# Patient Record
Sex: Male | Born: 2000 | Race: Black or African American | Hispanic: No | Marital: Single | Smoking: Never smoker
Health system: Southern US, Community
[De-identification: ages and names within clinical notes are randomized; demographics above are authoritative.]

## PROBLEM LIST (undated history)

## (undated) DIAGNOSIS — Z21 Asymptomatic human immunodeficiency virus [HIV] infection status: Secondary | ICD-10-CM

## (undated) HISTORY — PX: OTHER SURGICAL HISTORY: SHX169

---
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
## 2019-05-27 ENCOUNTER — Emergency Department (HOSPITAL_BASED_OUTPATIENT_CLINIC_OR_DEPARTMENT_OTHER)
Admission: EM | Admit: 2019-05-27 | Discharge: 2019-05-27 | Disposition: A | Discharge status: Home / Self Care | Payer: Self-pay | Attending: Emergency Medicine | Admitting: Emergency Medicine

## 2019-05-27 ENCOUNTER — Emergency Department (HOSPITAL_BASED_OUTPATIENT_CLINIC_OR_DEPARTMENT_OTHER): Payer: Self-pay

## 2019-05-27 ENCOUNTER — Other Ambulatory Visit: Payer: Self-pay

## 2019-05-27 ENCOUNTER — Encounter (HOSPITAL_BASED_OUTPATIENT_CLINIC_OR_DEPARTMENT_OTHER): Payer: Self-pay

## 2019-05-27 DIAGNOSIS — Y998 Other external cause status: Secondary | ICD-10-CM | POA: Insufficient documentation

## 2019-05-27 DIAGNOSIS — S63502A Unspecified sprain of left wrist, initial encounter: Secondary | ICD-10-CM

## 2019-05-27 DIAGNOSIS — Y92410 Unspecified street and highway as the place of occurrence of the external cause: Secondary | ICD-10-CM | POA: Insufficient documentation

## 2019-05-27 DIAGNOSIS — Y93I9 Activity, other involving external motion: Secondary | ICD-10-CM | POA: Insufficient documentation

## 2019-05-27 MED ORDER — ACETAMINOPHEN 325 MG PO TABS
650.0000 mg | ORAL_TABLET | Freq: Once | ORAL | Status: AC
  Administered 2019-05-27: 13:00:00 650 mg via ORAL
  Filled 2019-05-27: qty 2
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
## 2019-05-27 NOTE — ED Notes (Signed)
Pt denies back or neck pain at this time. Denies loc.
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
## 2020-05-11 ENCOUNTER — Encounter (HOSPITAL_BASED_OUTPATIENT_CLINIC_OR_DEPARTMENT_OTHER): Payer: Self-pay

## 2020-05-11 ENCOUNTER — Other Ambulatory Visit: Payer: Self-pay

## 2020-05-11 ENCOUNTER — Emergency Department (HOSPITAL_BASED_OUTPATIENT_CLINIC_OR_DEPARTMENT_OTHER)
Admission: EM | Admit: 2020-05-11 | Discharge: 2020-05-11 | Disposition: A | Discharge status: Home / Self Care | Payer: Self-pay | Attending: Emergency Medicine | Admitting: Emergency Medicine

## 2020-05-11 DIAGNOSIS — R42 Dizziness and giddiness: Secondary | ICD-10-CM | POA: Insufficient documentation

## 2020-05-11 DIAGNOSIS — R001 Bradycardia, unspecified: Secondary | ICD-10-CM | POA: Insufficient documentation

## 2020-05-11 LAB — BASIC METABOLIC PANEL
Anion gap: 9 (ref 5–15)
BUN: 14 mg/dL (ref 6–20)
CO2: 23 mmol/L (ref 22–32)
Calcium: 8.9 mg/dL (ref 8.9–10.3)
Chloride: 106 mmol/L (ref 98–111)
Creatinine, Ser: 1.02 mg/dL (ref 0.61–1.24)
GFR calc Af Amer: >60 mL/min (ref >60)
GFR calc non Af Amer: >60 mL/min (ref >60)
Glucose, Bld: 75 mg/dL (ref 70–99)
Potassium: 4.5 mmol/L (ref 3.5–5.1)
Sodium: 138 mmol/L (ref 135–145)

## 2020-05-11 LAB — CBC
HCT: 45.6 % (ref 39.0–52.0)
Hemoglobin: 15 g/dL (ref 13.0–17.0)
MCH: 31.1 pg (ref 26.0–34.0)
MCHC: 32.9 g/dL (ref 30.0–36.0)
MCV: 94.6 fL (ref 80.0–100.0)
Platelets: 280 10*3/uL (ref 150–400)
RBC: 4.82 MIL/uL (ref 4.22–5.81)
RDW: 12.5 % (ref 11.5–15.5)
WBC: 6.5 10*3/uL (ref 4.0–10.5)
nRBC: 0 % (ref 0.0–0.2)

## 2020-05-11 LAB — CBG MONITORING, ED: Glucose-Capillary: 89 mg/dL (ref 70–99)

## 2020-05-11 MED ORDER — SODIUM CHLORIDE 0.9% FLUSH
3.0000 mL | Freq: Once | INTRAVENOUS | Status: DC
  Filled 2020-05-11: qty 3

## 2020-05-11 NOTE — ED Triage Notes (Signed)
Pt states he woke up dizzy ~9am today-denies fever/flu sx-denies pain-sates he has hx of dizziness when he gets hot-NAD-steady gait

## 2020-05-11 NOTE — ED Provider Notes (Signed)
[facility] EMERGENCY DEPARTMENT Provider Note   CSN: [id number] Arrival date & time: 05/11/20  1434     History Chief Complaint  Patient presents with  . Dizziness    Jesus Cherry is a 19 y.o. male.  HPI 19 year old male with complaints of dizziness.  Patient states he woke up this morning with feeling dizzy, states he has a history of this when he spends a lot of time outside.  He says that like yesterday he spent some time outside with friends.  States that he does not drink any water.  He denies any vision changes, headaches, nausea, vomiting, chest pain or shortness of breath.  He states that he felt a little better after he woke up, however when he was in the ER, he had an episode where he felt he was going to pass out.  He had reported to nursing staff in the ER that he has a history of passing out when he sees blood.  He started to feel dizzy as blood was being drawn.  He denies any dizziness or any other symptoms at this current moment.  He has not passed out or hit his head.    History reviewed. No pertinent past medical history.  There are no problems to display for this patient.   History reviewed. No pertinent surgical history.     No family history on file.  Social History   Tobacco Use  . Smoking status: Never Smoker  . Smokeless tobacco: Never Used  Vaping Use  . Vaping Use: Never used  Substance Use Topics  . Alcohol use: Never  . Drug use: No    Home Medications Prior to Admission medications   Medication Sig Start Date End Date Taking? Authorizing Provider  cetirizine (ZYRTEC) 5 MG chewable tablet Chew 5 mg by mouth daily.    [provider]  clotrimazole (LOTRIMIN) 1 % cream Apply to affected area 2 times daily 09/05/14   Pollina, Canary Brim, MD  griseofulvin (GRIFULVIN V) 500 MG tablet Take 1 tablet (500 mg total) by mouth 2 (two) times daily. 09/05/14   Gilda Crease, MD  montelukast (SINGULAIR) 10 MG tablet Take 10  mg by mouth at bedtime.    [provider]  phentermine 37.5 MG capsule Take 37.5 mg by mouth every morning.    [provider]    Allergies    Patient has no known allergies.  Review of Systems   Review of Systems  Constitutional: Negative for chills and fever.  Eyes: Negative for photophobia and visual disturbance.  Respiratory: Negative for cough and shortness of breath.   Cardiovascular: Negative for chest pain.  Gastrointestinal: Negative for abdominal pain and nausea.  Neurological: Positive for dizziness. Negative for syncope, weakness and headaches.    Physical Exam Updated Vital Signs BP (!) 100/50 (BP Location: Right Arm)   Pulse (!) 59   Temp 98.1 F (36.7 C) (Oral)   Resp 18   Ht 5\' 7"  (1.702 m)   Wt 82.1 kg   SpO2 100%   BMI 28.35 kg/m   Physical Exam Vitals and nursing note reviewed.  Constitutional:      Appearance: Normal appearance. He is well-developed.  HENT:     Head: Normocephalic and atraumatic.     Comments: Horizontal nystagmus noted    Mouth/Throat:     Mouth: Mucous membranes are moist.     Pharynx: Oropharynx is clear.  Eyes:     Conjunctiva/sclera: Conjunctivae normal.  Cardiovascular:  Rate and Rhythm: Normal rate and regular rhythm.     Heart sounds: No murmur heard.   Pulmonary:     Effort: Pulmonary effort is normal. No respiratory distress.     Breath sounds: Normal breath sounds.  Abdominal:     General: Abdomen is flat.     Palpations: Abdomen is soft.     Tenderness: There is no abdominal tenderness.  Musculoskeletal:        General: No tenderness or signs of injury.     Cervical back: Normal range of motion and neck supple.  Skin:    General: Skin is warm and dry.     Capillary Refill: Capillary refill takes less than 2 seconds.     Findings: No erythema or rash.  Neurological:     General: No focal deficit present.     Mental Status: He is alert and oriented to person, place, and time.      Sensory: No sensory deficit.     Motor: No weakness.     ED Results / Procedures / Treatments   Labs (all labs ordered are listed, but only abnormal results are displayed) Labs Reviewed  BASIC METABOLIC PANEL  CBC  URINALYSIS, ROUTINE W REFLEX MICROSCOPIC  CBG MONITORING, ED    EKG EKG Interpretation  Date/Time:  Wednesday May 11 2020 15:09:12 EDT Ventricular Rate:  48 PR Interval:  144 QRS Duration: 92 QT Interval:  402 QTC Calculation: 359 R Axis:   93 Text Interpretation: Sinus bradycardia Possible Left atrial enlargement Rightward axis Borderline ECG No prior ecg for comparison Confirmed by Geoffery Lyons ([id number]) on 05/11/2020 4:13:10 PM   Radiology No results found.  Procedures Procedures (including critical care time)  Medications Ordered in ED Medications  sodium chloride flush (NS) 0.9 % injection 3 mL (3 mLs Intravenous Not Given 05/11/20 1612)    ED Course  I have reviewed the triage vital signs and the nursing notes.  Pertinent labs & imaging results that were available during my care of the patient were reviewed by me and considered in my medical decision making (see chart for details).    MDM Rules/Calculators/A&P                         Patient here complains of several episodes of dizziness today.  No complaints at this time.  Some horizontal nystagmus noted, but otherwise neuro exam normal.  I personally reviewed his lab work, which showed no abnormalities on CBC or BMP.  CBG of 89.  EKG sinus bradycardia, but no arrhythmias.  Soft blood pressures, patient given some oral fluids here in the ER and reports feeling better.  Orthostatic blood pressures performed which were normal.  Do not think he needs any additional imaging of his head or any further work-up at this time.  Patient is able to ambulate in the ED without any episodes of dizziness.  He is overall feeling well and is ready to go home.  I think this is reasonable.  I encouraged him to follow-up  with his primary care doctor.  Return precautions discussed.  All the patient's questions have been answered to his satisfaction, he voices understanding and is agreeable to this plan.  At this stage in the ED course, the patient is medically screened and stable for discharge.  Final Clinical Impression(s) / ED Diagnoses Final diagnoses:  Dizziness    Rx / DC Orders ED Discharge Orders    None  Mare Ferrari, PA-C 05/11/20 1754    Geoffery Lyons, MD 05/11/20 [phone]

## 2020-05-11 NOTE — Discharge Instructions (Signed)
Please make sure to follow-up with your primary care doctor about your dizziness.  Please make sure to drink your body weight in ounces of water.  Return to the ER if your symptoms worsen.

## 2020-05-11 NOTE — ED Notes (Addendum)
After CBG I stepped outside triage room to get EKG machine when entered pt states he feels like he is going to pass out-pt placed supine with head down/feet up-EKG performed-pt denies hx of near syncope/syncope with sight of blood/blood draw-pt states he felt better-labs drawn-pt up to w/c and to ED WR

## 2020-05-11 NOTE — ED Notes (Signed)
PT given water per charge RN, Plans to orthostatic vital sign and ambulate per RN

## 2020-07-23 ENCOUNTER — Emergency Department (HOSPITAL_BASED_OUTPATIENT_CLINIC_OR_DEPARTMENT_OTHER)
Admission: EM | Admit: 2020-07-23 | Discharge: 2020-07-23 | Disposition: A | Discharge status: Home / Self Care | Payer: Self-pay | Attending: Emergency Medicine | Admitting: Emergency Medicine

## 2020-07-23 ENCOUNTER — Encounter (HOSPITAL_BASED_OUTPATIENT_CLINIC_OR_DEPARTMENT_OTHER): Payer: Self-pay | Admitting: Emergency Medicine

## 2020-07-23 ENCOUNTER — Other Ambulatory Visit: Payer: Self-pay

## 2020-07-23 DIAGNOSIS — Z20822 Contact with and (suspected) exposure to covid-19: Secondary | ICD-10-CM | POA: Insufficient documentation

## 2020-07-23 DIAGNOSIS — J020 Streptococcal pharyngitis: Secondary | ICD-10-CM | POA: Insufficient documentation

## 2020-07-23 DIAGNOSIS — E860 Dehydration: Secondary | ICD-10-CM | POA: Insufficient documentation

## 2020-07-23 LAB — COMPREHENSIVE METABOLIC PANEL
ALT: 18 U/L (ref 0–44)
AST: 30 U/L (ref 15–41)
Albumin: 3.6 g/dL (ref 3.5–5.0)
Alkaline Phosphatase: 52 U/L (ref 38–126)
Anion gap: 10 (ref 5–15)
BUN: 9 mg/dL (ref 6–20)
CO2: 25 mmol/L (ref 22–32)
Calcium: 8.2 mg/dL — ABNORMAL LOW (ref 8.9–10.3)
Chloride: 98 mmol/L (ref 98–111)
Creatinine, Ser: 1.24 mg/dL (ref 0.61–1.24)
GFR calc Af Amer: >60 mL/min (ref >60)
GFR calc non Af Amer: >60 mL/min (ref >60)
Glucose, Bld: 82 mg/dL (ref 70–99)
Potassium: 4 mmol/L (ref 3.5–5.1)
Sodium: 133 mmol/L — ABNORMAL LOW (ref 135–145)
Total Bilirubin: <0.1 mg/dL — ABNORMAL LOW (ref 0.3–1.2)
Total Protein: 7.5 g/dL (ref 6.5–8.1)

## 2020-07-23 LAB — CBC WITH DIFFERENTIAL/PLATELET
Abs Immature Granulocytes: 0.01 10*3/uL (ref 0.00–0.07)
Basophils Absolute: 0 10*3/uL (ref 0.0–0.1)
Basophils Relative: 0 %
Eosinophils Absolute: 0 10*3/uL (ref 0.0–0.5)
Eosinophils Relative: 0 %
HCT: 45.3 % (ref 39.0–52.0)
Hemoglobin: 15.1 g/dL (ref 13.0–17.0)
Immature Granulocytes: 0 %
Lymphocytes Relative: 41 %
Lymphs Abs: 0.9 10*3/uL (ref 0.7–4.0)
MCH: 30.6 pg (ref 26.0–34.0)
MCHC: 33.3 g/dL (ref 30.0–36.0)
MCV: 91.7 fL (ref 80.0–100.0)
Monocytes Absolute: 0.2 10*3/uL (ref 0.1–1.0)
Monocytes Relative: 9 %
Neutro Abs: 1.1 10*3/uL — ABNORMAL LOW (ref 1.7–7.7)
Neutrophils Relative %: 50 %
Platelets: 165 10*3/uL (ref 150–400)
RBC: 4.94 MIL/uL (ref 4.22–5.81)
RDW: 12.6 % (ref 11.5–15.5)
WBC: 2.3 10*3/uL — ABNORMAL LOW (ref 4.0–10.5)
nRBC: 0 % (ref 0.0–0.2)

## 2020-07-23 LAB — MONONUCLEOSIS SCREEN: Mono Screen: NEGATIVE

## 2020-07-23 LAB — RESPIRATORY PANEL BY RT PCR (FLU A&B, COVID)
Influenza A by PCR: NEGATIVE
Influenza B by PCR: NEGATIVE
SARS Coronavirus 2 by RT PCR: NEGATIVE

## 2020-07-23 LAB — GROUP A STREP BY PCR: Group A Strep by PCR: DETECTED — AB

## 2020-07-23 MED ORDER — IBUPROFEN 600 MG PO TABS: 600.0000 mg | ORAL_TABLET | Freq: Four times a day (QID) | ORAL | 0 refills | Status: AC | PRN

## 2020-07-23 MED ORDER — PENICILLIN G BENZATHINE 1200000 UNIT/2ML IM SUSP
1.2000 10*6.[IU] | Freq: Once | INTRAMUSCULAR | Status: AC
  Administered 2020-07-23: 1.2 10*6.[IU] via INTRAMUSCULAR
  Filled 2020-07-23: qty 2

## 2020-07-23 MED ORDER — IBUPROFEN 800 MG PO TABS
800.0000 mg | ORAL_TABLET | Freq: Once | ORAL | Status: AC
  Administered 2020-07-23: 800 mg via ORAL
  Filled 2020-07-23: qty 1

## 2020-07-23 MED ORDER — ACETAMINOPHEN 500 MG PO TABS
1000.0000 mg | ORAL_TABLET | Freq: Once | ORAL | Status: AC
  Administered 2020-07-23: 1000 mg via ORAL
  Filled 2020-07-23: qty 2

## 2020-07-23 MED ORDER — SODIUM CHLORIDE 0.9 % IV BOLUS
1000.0000 mL | Freq: Once | INTRAVENOUS | Status: AC
  Administered 2020-07-23: 1000 mL via INTRAVENOUS

## 2020-07-23 NOTE — ED Provider Notes (Signed)
[facility] EMERGENCY DEPARTMENT Provider Note   CSN: [id number] Arrival date & time: 07/23/20  1123     History Chief Complaint  Patient presents with   Headache   Fever   Nausea    Jesus Cherry is a 19 y.o. male.  Pt presents to the ED today with fever, sore throat, headache.  Pt said he's been feeling ill for the past week.  He's not been vaccinated against Covid.  He has not taken anything for his fever.        History reviewed. No pertinent past medical history.  There are no problems to display for this patient.   History reviewed. No pertinent surgical history.     History reviewed. No pertinent family history.  Social History   Tobacco Use   Smoking status: Never Smoker   Smokeless tobacco: Never Used  Vaping Use   Vaping Use: Never used  Substance Use Topics   Alcohol use: Never   Drug use: No    Home Medications Prior to Admission medications   Medication Sig Start Date End Date Taking? Authorizing Provider  cetirizine (ZYRTEC) 5 MG chewable tablet Chew 5 mg by mouth daily.    [provider]  clotrimazole (LOTRIMIN) 1 % cream Apply to affected area 2 times daily 09/05/14   Pollina, Canary Brim, MD  griseofulvin (GRIFULVIN V) 500 MG tablet Take 1 tablet (500 mg total) by mouth 2 (two) times daily. 09/05/14   Gilda Crease, MD  ibuprofen (ADVIL) 600 MG tablet Take 1 tablet (600 mg total) by mouth every 6 (six) hours as needed. 07/23/20   Jacalyn Lefevre, MD  montelukast (SINGULAIR) 10 MG tablet Take 10 mg by mouth at bedtime.    [provider]  phentermine 37.5 MG capsule Take 37.5 mg by mouth every morning.    [provider]    Allergies    Patient has no known allergies.  Review of Systems   Review of Systems  Constitutional: Positive for fever.  HENT: Positive for sore throat.   Neurological: Positive for weakness and headaches.  All other systems reviewed and are  negative.   Physical Exam Updated Vital Signs BP (!) 101/51 (BP Location: Left Arm)    Pulse 82    Temp (!) 103.1 F (39.5 C) (Oral)    Resp 18    SpO2 100%   Physical Exam Vitals and nursing note reviewed.  Constitutional:      Appearance: He is well-developed.  HENT:     Head: Normocephalic and atraumatic.     Mouth/Throat:     Mouth: Mucous membranes are moist.     Pharynx: Pharyngeal swelling and posterior oropharyngeal erythema present.  Eyes:     Extraocular Movements: Extraocular movements intact.     Pupils: Pupils are equal, round, and reactive to light.  Cardiovascular:     Rate and Rhythm: Normal rate and regular rhythm.  Pulmonary:     Effort: Pulmonary effort is normal.     Breath sounds: Normal breath sounds.  Abdominal:     General: Bowel sounds are normal.     Palpations: Abdomen is soft.  Musculoskeletal:        General: Normal range of motion.     Cervical back: Normal range of motion and neck supple.  Skin:    General: Skin is warm.     Capillary Refill: Capillary refill takes less than 2 seconds.  Neurological:     Mental Status: He is alert  and oriented to person, place, and time.  Psychiatric:        Mood and Affect: Mood normal.        Speech: Speech normal.        Behavior: Behavior normal.     ED Results / Procedures / Treatments   Labs (all labs ordered are listed, but only abnormal results are displayed) Labs Reviewed  GROUP A STREP BY PCR - Abnormal; Notable for the following components:      Result Value   Group A Strep by PCR DETECTED (*)    All other components within normal limits  CBC WITH DIFFERENTIAL/PLATELET - Abnormal; Notable for the following components:   WBC 2.3 (*)    Neutro Abs 1.1 (*)    All other components within normal limits  COMPREHENSIVE METABOLIC PANEL - Abnormal; Notable for the following components:   Sodium 133 (*)    Calcium 8.2 (*)    Total Bilirubin <0.1 (*)    All other components within normal limits   RESPIRATORY PANEL BY RT PCR (FLU A&B, COVID)  MONONUCLEOSIS SCREEN    EKG None  Radiology No results found.  Procedures Procedures (including critical care time)  Medications Ordered in ED Medications  sodium chloride 0.9 % bolus 1,000 mL (0 mLs Intravenous Stopped 07/23/20 1402)  ibuprofen (ADVIL) tablet 800 mg (800 mg Oral Given 07/23/20 1240)  penicillin g benzathine (BICILLIN LA) 1200000 UNIT/2ML injection 1.2 Million Units (1.2 Million Units Intramuscular Given 07/23/20 1337)  acetaminophen (TYLENOL) tablet 1,000 mg (1,000 mg Oral Given 07/23/20 1337)    ED Course  I have reviewed the triage vital signs and the nursing notes.  Pertinent labs & imaging results that were available during my care of the patient were reviewed by me and considered in my medical decision making (see chart for details).    MDM Rules/Calculators/A&P                          Pt's fever treated with tylenol and ibuprofen.  Covid negative.  He is + for strep.  He opted for the bicillin IM injection.  He is feeling better after IVFs.  He is stable for d/c. Return if worse.  Jesus Cherry was evaluated in Emergency Department on 07/23/2020 for the symptoms described in the history of present illness. He was evaluated in the context of the global COVID-19 pandemic, which necessitated consideration that the patient might be at risk for infection with the SARS-CoV-2 virus that causes COVID-19. Institutional protocols and algorithms that pertain to the evaluation of patients at risk for COVID-19 are in a state of rapid change based on information released by regulatory bodies including the CDC and federal and state organizations. These policies and algorithms were followed during the patient's care in the ED.  Final Clinical Impression(s) / ED Diagnoses Final diagnoses:  Strep pharyngitis  Dehydration    Rx / DC Orders ED Discharge Orders         Ordered    ibuprofen (ADVIL) 600 MG tablet  Every 6 hours  PRN        07/23/20 1416           Jacalyn Lefevre, MD 07/23/20 1417

## 2020-07-23 NOTE — ED Triage Notes (Signed)
Pt here with severe headaches x 1 week with fever and nausea. Not vaccinated for COVID

## 2020-07-23 NOTE — Discharge Instructions (Addendum)
Alternate tylenol and ibuprofen for fever. °

## 2020-07-23 NOTE — ED Notes (Signed)
Pt ambulatory with steady gait to restroom 

## 2024-10-04 ENCOUNTER — Encounter (HOSPITAL_BASED_OUTPATIENT_CLINIC_OR_DEPARTMENT_OTHER): Payer: Self-pay | Admitting: Emergency Medicine

## 2024-10-04 ENCOUNTER — Emergency Department (HOSPITAL_BASED_OUTPATIENT_CLINIC_OR_DEPARTMENT_OTHER)
Admission: EM | Admit: 2024-10-04 | Discharge: 2024-10-04 | Disposition: A | Discharge status: Home / Self Care | Payer: Self-pay

## 2024-10-04 ENCOUNTER — Other Ambulatory Visit: Payer: Self-pay

## 2024-10-04 ENCOUNTER — Emergency Department (HOSPITAL_BASED_OUTPATIENT_CLINIC_OR_DEPARTMENT_OTHER): Payer: Self-pay

## 2024-10-04 DIAGNOSIS — R197 Diarrhea, unspecified: Secondary | ICD-10-CM | POA: Insufficient documentation

## 2024-10-04 DIAGNOSIS — Z21 Asymptomatic human immunodeficiency virus [HIV] infection status: Secondary | ICD-10-CM | POA: Insufficient documentation

## 2024-10-04 DIAGNOSIS — M791 Myalgia, unspecified site: Secondary | ICD-10-CM | POA: Insufficient documentation

## 2024-10-04 DIAGNOSIS — R519 Headache, unspecified: Secondary | ICD-10-CM | POA: Insufficient documentation

## 2024-10-04 DIAGNOSIS — R509 Fever, unspecified: Secondary | ICD-10-CM | POA: Insufficient documentation

## 2024-10-04 HISTORY — DX: Asymptomatic human immunodeficiency virus (hiv) infection status: Z21

## 2024-10-04 LAB — RESP PANEL BY RT-PCR (RSV, FLU A&B, COVID)  RVPGX2
Influenza A by PCR: NEGATIVE
Influenza B by PCR: NEGATIVE
Resp Syncytial Virus by PCR: NEGATIVE
SARS Coronavirus 2 by RT PCR: NEGATIVE

## 2024-10-04 LAB — CBC WITH DIFFERENTIAL/PLATELET
Abs Immature Granulocytes: 0.02 K/uL (ref 0.00–0.07)
Basophils Absolute: 0 K/uL (ref 0.0–0.1)
Basophils Relative: 0 %
Eosinophils Absolute: 0 K/uL (ref 0.0–0.5)
Eosinophils Relative: 0 %
HCT: 42.2 % (ref 39.0–52.0)
Hemoglobin: 14.4 g/dL (ref 13.0–17.0)
Immature Granulocytes: 0 %
Lymphocytes Relative: 12 %
Lymphs Abs: 0.8 K/uL (ref 0.7–4.0)
MCH: 31.6 pg (ref 26.0–34.0)
MCHC: 34.1 g/dL (ref 30.0–36.0)
MCV: 92.7 fL (ref 80.0–100.0)
Monocytes Absolute: 0.6 K/uL (ref 0.1–1.0)
Monocytes Relative: 9 %
Neutro Abs: 5.2 K/uL (ref 1.7–7.7)
Neutrophils Relative %: 79 %
Platelets: 192 K/uL (ref 150–400)
RBC: 4.55 MIL/uL (ref 4.22–5.81)
RDW: 12.7 % (ref 11.5–15.5)
WBC: 6.6 K/uL (ref 4.0–10.5)
nRBC: 0 % (ref 0.0–0.2)

## 2024-10-04 LAB — COMPREHENSIVE METABOLIC PANEL WITH GFR
ALT: 15 U/L (ref 0–44)
AST: 21 U/L (ref 15–41)
Albumin: 4.3 g/dL (ref 3.5–5.0)
Alkaline Phosphatase: 78 U/L (ref 38–126)
Anion gap: 12 (ref 5–15)
BUN: 6 mg/dL (ref 6–20)
CO2: 24 mmol/L (ref 22–32)
Calcium: 9.3 mg/dL (ref 8.9–10.3)
Chloride: 101 mmol/L (ref 98–111)
Creatinine, Ser: 1.04 mg/dL (ref 0.61–1.24)
GFR, Estimated: >60 mL/min (ref >60)
Glucose, Bld: 100 mg/dL — ABNORMAL HIGH (ref 70–99)
Potassium: 3.8 mmol/L (ref 3.5–5.1)
Sodium: 137 mmol/L (ref 135–145)
Total Bilirubin: 0.8 mg/dL (ref 0.0–1.2)
Total Protein: 7.2 g/dL (ref 6.5–8.1)

## 2024-10-04 MED ORDER — IOHEXOL 300 MG/ML  SOLN
100.0000 mL | Freq: Once | INTRAMUSCULAR | Status: AC | PRN
  Administered 2024-10-04: 100 mL via INTRAVENOUS

## 2024-10-04 MED ORDER — ONDANSETRON 4 MG PO TBDP: 4.0000 mg | ORAL_TABLET | Freq: Three times a day (TID) | ORAL | 0 refills | Status: AC | PRN

## 2024-10-04 MED ORDER — LACTATED RINGERS IV BOLUS
1000.0000 mL | Freq: Once | INTRAVENOUS | Status: AC
  Administered 2024-10-04: 1000 mL via INTRAVENOUS

## 2024-10-04 MED ORDER — KETOROLAC TROMETHAMINE 15 MG/ML IJ SOLN
15.0000 mg | Freq: Once | INTRAMUSCULAR | Status: AC
  Administered 2024-10-04: 15 mg via INTRAVENOUS
  Filled 2024-10-04: qty 1

## 2024-10-04 NOTE — ED Provider Notes (Signed)
 [place] EMERGENCY DEPARTMENT AT [facility] Provider Note   CSN: [id number] Arrival date & time: 10/04/24  1506     Patient presents with: Fever   Jesus Cherry is a 23 y.o. male.   23 year old male with past medical history of HIV on Shiela therapy presents to the emergency department today with fever, myalgias, headache, and diarrhea.  The patient states that he had subjective fevers yesterday.  Reports that his highest temperature at home when he is checked has been 99.6.  He states that he has had 5 episodes of diarrhea yesterday and 2 today.  This been nonbloody.  The patient denies any recent travel.  He states that he is on medications for his HIV and his viral load is undetectable.  He denies any URI symptoms.  Denies any vomiting.  He came to the emergency department today for further evaluation regarding this.  Denies being around any known sick contacts that he is aware of.  He states that yesterday when he felt his temperature was high he had a headache but states that this has improved significantly and reports the headache now is a 2 out of 10.  Denies any neck pain or stiffness.   Fever      Prior to Admission medications   Medication Sig Start Date End Date Taking? Authorizing Provider  cetirizine (ZYRTEC) 5 MG chewable tablet Chew 5 mg by mouth daily.    [provider]  clotrimazole  (LOTRIMIN ) 1 % cream Apply to affected area 2 times daily 09/05/14   Pollina, Lonni PARAS, MD  griseofulvin  (GRIFULVIN V ) 500 MG tablet Take 1 tablet (500 mg total) by mouth 2 (two) times daily. 09/05/14   Haze Lonni PARAS, MD  ibuprofen  (ADVIL ) 600 MG tablet Take 1 tablet (600 mg total) by mouth every 6 (six) hours as needed. 07/23/20   Haviland, Julie, MD  montelukast (SINGULAIR) 10 MG tablet Take 10 mg by mouth at bedtime.    [provider]  phentermine 37.5 MG capsule Take 37.5 mg by mouth every morning.    [provider]    Allergies:  Patient has no known allergies.    Review of Systems  Constitutional:  Positive for fever.  All other systems reviewed and are negative.   Updated Vital Signs BP 137/79 (BP Location: Right Arm)   Pulse 69   Temp 98.9 F (37.2 C) (Oral)   Resp 16   Ht 5' 8 (1.727 m)   Wt 80.7 kg   SpO2 100%   BMI 27.06 kg/m   Physical Exam Vitals and nursing note reviewed.   Gen: NAD Eyes: PERRL, EOMI HEENT: no oropharyngeal swelling Neck: trachea midline, no meningismus Resp: clear to auscultation bilaterally Card: RRR, no murmurs, rubs, or gallops Abd: Mild tenderness over the right lower quadrant with no guarding or rebound Extremities: no calf tenderness, no edema Vascular: 2+ radial pulses bilaterally, 2+ DP pulses bilaterally Skin: no rashes Psyc: acting appropriately   (all labs ordered are listed, but only abnormal results are displayed) Labs Reviewed  COMPREHENSIVE METABOLIC PANEL WITH GFR - Abnormal; Notable for the following components:      Result Value   Glucose, Bld 100 (*)    All other components within normal limits  RESP PANEL BY RT-PCR (RSV, FLU A&B, COVID)  [id number]  CBC WITH DIFFERENTIAL/PLATELET    EKG: None  Radiology: CT ABDOMEN PELVIS W CONTRAST Result Date: 10/04/2024 EXAM: CT ABDOMEN AND PELVIS WITH CONTRAST 10/04/2024 06:31:24 PM TECHNIQUE: CT of  the abdomen and pelvis was performed with the administration of intravenous contrast. 100 mL of iohexol  (OMNIPAQUE ) 300 MG/ML solution was administered. Multiplanar reformatted images are provided for review. Automated exposure control, iterative reconstruction, and/or weight-based adjustment of the mA/kV was utilized to reduce the radiation dose to as low as reasonably achievable. COMPARISON: None available. CLINICAL HISTORY: Abdominal pain, acute, nonlocalized. FINDINGS: LOWER CHEST: No acute abnormality. LIVER: The liver is unremarkable. GALLBLADDER AND BILE DUCTS: Gallbladder is unremarkable. No biliary ductal  dilatation. SPLEEN: No acute abnormality. PANCREAS: No acute abnormality. ADRENAL GLANDS: No acute abnormality. KIDNEYS, URETERS AND BLADDER: Subcentimeter hyperdensity within the right kidney, too small to characterize - no further follow-up indicated. No stones in the kidneys or ureters. No hydronephrosis. No perinephric or periureteral stranding. Urinary bladder is unremarkable. GI AND BOWEL: Stomach demonstrates no acute abnormality. Small small bowel intussusception within the left lower quadrant incidentally noted. No small or large bowel wall thickening or dilatation. The appendix is unremarkable. PERITONEUM AND RETROPERITONEUM: No ascites. No free air. VASCULATURE: Aorta is normal in caliber. LYMPH NODES: No lymphadenopathy. REPRODUCTIVE ORGANS: No acute abnormality. BONES AND SOFT TISSUES: No acute osseous abnormality. No focal soft tissue abnormality. IMPRESSION: 1. Incidentally noted small- small bowel intussusception in the left lower quadrant. Finding may be the reason for the patient's pain; however, these are usually incidental with no treatment required. 2. Other, non-acute and/or normal findings as above. Electronically signed by: Morgane Naveau MD 10/04/2024 06:47 PM EST RP Workstation: [id number]     Procedures   Medications Ordered in the ED  lactated ringers  bolus 1,000 mL (1,000 mLs Intravenous New Bag/Given 10/04/24 1826)  ketorolac  (TORADOL ) 15 MG/ML injection 15 mg (15 mg Intravenous Given 10/04/24 1821)  iohexol  (OMNIPAQUE ) 300 MG/ML solution 100 mL (100 mLs Intravenous Contrast Given 10/04/24 1826)                                    Medical Decision Making 23 year old male with past medical history of HIV on Hart therapy with undetectable viral load presenting to the emergency department today with diarrhea, myalgias, headache, and diarrhea.  Patient does have some mild right lower quadrant abdominal tenderness here.  Will further evaluate him here with a CT scan to evaluate  for appendicitis, colitis, diverticulitis, or other intra-abdominal pathology.  The patient is otherwise well-appearing.  His vital signs are reassuring.  He is afebrile here.  His labs drawn at triage are unremarkable.  I think that if his workup is reassuring that he could potentially be discharged.  He does not have any findings on exam consistent with meningitis at this time.  I will reevaluate for ultimate disposition.  The patient's labs are reassuring here.  CT scan shows findings of intussusception with no other acute findings.  The patient denying any pain on reassessment.  He will be discharged with return precautions.  Amount and/or Complexity of Data Reviewed Labs: ordered. Radiology: ordered.  Risk Prescription drug management.        Final diagnoses:  Diarrhea, unspecified type    ED Discharge Orders     None          Ula Prentice SAUNDERS, MD 10/04/24 [phone]

## 2024-10-04 NOTE — ED Triage Notes (Signed)
 Pt via POV c/o chills, palpitations, headache, diarrhea x 5, temp 99.7. Pt has nausea but denies V/D. Headache rated 8/10 after OTC meds. No known sick contacts.

## 2024-10-04 NOTE — Discharge Instructions (Signed)
 Your workup today was reassuring.  Please take Tylenol  or ibuprofen  at home as needed for pain or fever.  I think that your symptoms may be due to viral illness.  Your CT scan did not show any concerning findings.  Please follow-up with your doctor and return to the ER for worsening symptoms.
